# Patient Record
Sex: Male | Born: 1985 | Race: Black or African American | Hispanic: No | Marital: Single | Smoking: Current every day smoker
Health system: Southern US, Community
[De-identification: ages and names within clinical notes are randomized; demographics above are authoritative.]

## PROBLEM LIST (undated history)

## (undated) DIAGNOSIS — J45909 Unspecified asthma, uncomplicated: Secondary | ICD-10-CM

## (undated) HISTORY — PX: LEG SURGERY: SHX1003

---

## 2015-01-06 ENCOUNTER — Emergency Department (HOSPITAL_COMMUNITY)
Admission: EM | Admit: 2015-01-06 | Discharge: 2015-01-06 | Disposition: A | Discharge status: Home / Self Care | Payer: Self-pay | Attending: Emergency Medicine | Admitting: Emergency Medicine

## 2015-01-06 ENCOUNTER — Encounter (HOSPITAL_COMMUNITY): Payer: Self-pay | Admitting: Emergency Medicine

## 2015-01-06 ENCOUNTER — Emergency Department (HOSPITAL_COMMUNITY): Payer: Self-pay

## 2015-01-06 DIAGNOSIS — Y9389 Activity, other specified: Secondary | ICD-10-CM | POA: Insufficient documentation

## 2015-01-06 DIAGNOSIS — Y998 Other external cause status: Secondary | ICD-10-CM | POA: Insufficient documentation

## 2015-01-06 DIAGNOSIS — S02401A Maxillary fracture, unspecified, initial encounter for closed fracture: Secondary | ICD-10-CM | POA: Insufficient documentation

## 2015-01-06 DIAGNOSIS — S0011XA Contusion of right eyelid and periocular area, initial encounter: Secondary | ICD-10-CM | POA: Insufficient documentation

## 2015-01-06 DIAGNOSIS — J45909 Unspecified asthma, uncomplicated: Secondary | ICD-10-CM | POA: Insufficient documentation

## 2015-01-06 DIAGNOSIS — F17200 Nicotine dependence, unspecified, uncomplicated: Secondary | ICD-10-CM | POA: Insufficient documentation

## 2015-01-06 DIAGNOSIS — Y9289 Other specified places as the place of occurrence of the external cause: Secondary | ICD-10-CM | POA: Insufficient documentation

## 2015-01-06 DIAGNOSIS — K0889 Other specified disorders of teeth and supporting structures: Secondary | ICD-10-CM | POA: Insufficient documentation

## 2015-01-06 DIAGNOSIS — S0990XA Unspecified injury of head, initial encounter: Secondary | ICD-10-CM | POA: Insufficient documentation

## 2015-01-06 HISTORY — DX: Unspecified asthma, uncomplicated: J45.909

## 2015-01-06 MED ORDER — NAPROXEN 500 MG PO TABS: 500.0000 mg | ORAL_TABLET | Freq: Two times a day (BID) | ORAL | Status: DC

## 2015-01-06 MED ORDER — AMOXICILLIN 500 MG PO CAPS: 500.0000 mg | ORAL_CAPSULE | Freq: Three times a day (TID) | ORAL | Status: DC
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
## 2015-01-06 NOTE — Discharge Instructions (Signed)
Ice pack to the face. Take amoxil as prescribed until all gone for infection prevention. Naprosyn for pain. Follow up with Dr. Jeanice Limurham.

## 2015-01-15 ENCOUNTER — Emergency Department (HOSPITAL_COMMUNITY)
Admission: EM | Admit: 2015-01-15 | Discharge: 2015-01-15 | Disposition: A | Discharge status: Home / Self Care | Payer: Self-pay | Attending: Emergency Medicine | Admitting: Emergency Medicine

## 2015-01-15 ENCOUNTER — Encounter (HOSPITAL_COMMUNITY): Payer: Self-pay | Admitting: *Deleted

## 2015-01-15 DIAGNOSIS — Z792 Long term (current) use of antibiotics: Secondary | ICD-10-CM | POA: Insufficient documentation

## 2015-01-15 DIAGNOSIS — H5711 Ocular pain, right eye: Secondary | ICD-10-CM | POA: Insufficient documentation

## 2015-01-15 DIAGNOSIS — R51 Headache: Secondary | ICD-10-CM

## 2015-01-15 DIAGNOSIS — Z8781 Personal history of (healed) traumatic fracture: Secondary | ICD-10-CM | POA: Insufficient documentation

## 2015-01-15 DIAGNOSIS — R519 Headache, unspecified: Secondary | ICD-10-CM

## 2015-01-15 DIAGNOSIS — Z791 Long term (current) use of non-steroidal anti-inflammatories (NSAID): Secondary | ICD-10-CM | POA: Insufficient documentation

## 2015-01-15 DIAGNOSIS — F17200 Nicotine dependence, unspecified, uncomplicated: Secondary | ICD-10-CM | POA: Insufficient documentation

## 2015-01-15 DIAGNOSIS — R58 Hemorrhage, not elsewhere classified: Secondary | ICD-10-CM | POA: Insufficient documentation

## 2015-01-15 DIAGNOSIS — J45909 Unspecified asthma, uncomplicated: Secondary | ICD-10-CM | POA: Insufficient documentation

## 2015-01-15 MED ORDER — IBUPROFEN 400 MG PO TABS
800.0000 mg | ORAL_TABLET | Freq: Once | ORAL | Status: AC
  Administered 2015-01-15: 800 mg via ORAL
  Filled 2015-01-15: qty 2

## 2015-01-15 MED ORDER — IBUPROFEN 800 MG PO TABS: 800.0000 mg | ORAL_TABLET | Freq: Three times a day (TID) | ORAL | Status: DC
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
## 2015-01-15 NOTE — Discharge Instructions (Signed)
Mr. George Park,  Nice meeting you! Please follow-up with your primary care provider. Return to the emergency department if you develop inability to see, have increasing pain. Feel better soon!  S. Lane HackerNicole Kehinde Bowdish, PA-C

## 2015-02-25 ENCOUNTER — Encounter (HOSPITAL_COMMUNITY): Payer: Self-pay

## 2015-02-25 ENCOUNTER — Emergency Department (HOSPITAL_COMMUNITY)
Admission: EM | Admit: 2015-02-25 | Discharge: 2015-02-26 | Disposition: A | Discharge status: Home / Self Care | Payer: Self-pay | Attending: Emergency Medicine | Admitting: Emergency Medicine

## 2015-02-25 DIAGNOSIS — F17210 Nicotine dependence, cigarettes, uncomplicated: Secondary | ICD-10-CM | POA: Insufficient documentation

## 2015-02-25 DIAGNOSIS — H5711 Ocular pain, right eye: Secondary | ICD-10-CM | POA: Insufficient documentation

## 2015-02-25 DIAGNOSIS — R200 Anesthesia of skin: Secondary | ICD-10-CM | POA: Insufficient documentation

## 2015-02-25 DIAGNOSIS — Z8781 Personal history of (healed) traumatic fracture: Secondary | ICD-10-CM | POA: Insufficient documentation

## 2015-02-25 DIAGNOSIS — J45909 Unspecified asthma, uncomplicated: Secondary | ICD-10-CM | POA: Insufficient documentation

## 2015-02-25 DIAGNOSIS — L731 Pseudofolliculitis barbae: Secondary | ICD-10-CM | POA: Insufficient documentation

## 2015-02-25 NOTE — ED Notes (Signed)
PT CALLED IN WAITING ROOM BUT NO ANSWER
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
## 2015-02-25 NOTE — ED Notes (Signed)
CALLED IN WAITING ROOM X3 NO ANSWER

## 2015-02-26 ENCOUNTER — Encounter (HOSPITAL_COMMUNITY): Payer: Self-pay | Admitting: Emergency Medicine

## 2015-02-26 MED ORDER — IBUPROFEN 800 MG PO TABS
800.0000 mg | ORAL_TABLET | Freq: Once | ORAL | Status: AC
  Administered 2015-02-26: 800 mg via ORAL
  Filled 2015-02-26: qty 1

## 2015-02-26 MED ORDER — SULFAMETHOXAZOLE-TRIMETHOPRIM 800-160 MG PO TABS
1.0000 | ORAL_TABLET | Freq: Once | ORAL | Status: AC
  Administered 2015-02-26: 1 via ORAL
  Filled 2015-02-26: qty 1

## 2015-02-26 MED ORDER — IBUPROFEN 800 MG PO TABS: 800.0000 mg | ORAL_TABLET | Freq: Three times a day (TID) | ORAL | Status: DC

## 2015-02-26 MED ORDER — SULFAMETHOXAZOLE-TRIMETHOPRIM 800-160 MG PO TABS: 1.0000 | ORAL_TABLET | Freq: Two times a day (BID) | ORAL | Status: AC
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
## 2015-02-26 NOTE — Discharge Instructions (Signed)
Ingrown Hair An ingrown hair is a hair that curls and re-enters the skin instead of growing straight out of the skin. It happens most often with curly hair. It is usually more severe in the neck area, but it can occur in any shaved area, including the beard area, groin, scalp, and legs. An ingrown hair may cause small pockets of infection. CAUSES  Shaving closely, tweezing, or waxing, especially curly hair. Using hair removal creams can sometimes lead to ingrown hairs, especially in the groin. SYMPTOMS   Small bumps on the skin. The bumps may be filled with pus.  Pain.  Itching. DIAGNOSIS  Your caregiver can usually tell what is wrong by doing a physical exam. TREATMENT  If there is a severe infection, your caregiver may prescribe antibiotic medicines. Laser hair removal may also be done to help prevent regrowth of the hair. HOME CARE INSTRUCTIONS   Do not shave irritated skin. You may start shaving again once the irritation has gone away.  If you are prone to ingrown hairs, consider not shaving as much as possible.  If antibiotics are prescribed, take them as directed. Finish them even if you start to feel better.  You may use a facial sponge in a gentle circular motion to help dislodge ingrown hairs on the face.  You may use a hair removal cream weekly, especially on the legs and underarms. Stop using the cream if it irritates your skin. Use caution when using hair removal creams in the groin area. SHAVING INSTRUCTIONS AFTER TREATMENT  Shower before shaving. Keep areas to be shaved packed in warm, moist wraps for several minutes before shaving. The warm, moist environment helps soften the hairs and makes ingrown hairs less likely to occur.  Use thick shaving gels.  Use a bump fighter razor that cuts hair slightly above the skin level or use an electric shaver with a longer shave setting.  Shave in the direction of hair growth. Avoid making multiple razor strokes.  Use  moisturizing lotions after shaving.   This information is not intended to replace advice given to you by your health care provider. Make sure you discuss any questions you have with your health care provider.   Document Released: 04/07/2000 Document Revised: 07/01/2011 Document Reviewed: 04/01/2011 Elsevier Interactive Patient Education 2016 Elsevier Inc.  

## 2015-02-26 NOTE — ED Notes (Signed)
MD at bedside. 

## 2015-05-24 ENCOUNTER — Encounter (HOSPITAL_COMMUNITY): Payer: Self-pay | Admitting: Emergency Medicine

## 2015-05-24 ENCOUNTER — Emergency Department (HOSPITAL_COMMUNITY)
Admission: EM | Admit: 2015-05-24 | Discharge: 2015-05-24 | Disposition: A | Discharge status: Home / Self Care | Payer: Self-pay | Attending: Emergency Medicine | Admitting: Emergency Medicine

## 2015-05-24 DIAGNOSIS — S0083XA Contusion of other part of head, initial encounter: Secondary | ICD-10-CM | POA: Insufficient documentation

## 2015-05-24 DIAGNOSIS — Z791 Long term (current) use of non-steroidal anti-inflammatories (NSAID): Secondary | ICD-10-CM | POA: Insufficient documentation

## 2015-05-24 DIAGNOSIS — J45909 Unspecified asthma, uncomplicated: Secondary | ICD-10-CM | POA: Insufficient documentation

## 2015-05-24 DIAGNOSIS — Y9389 Activity, other specified: Secondary | ICD-10-CM | POA: Insufficient documentation

## 2015-05-24 DIAGNOSIS — W228XXA Striking against or struck by other objects, initial encounter: Secondary | ICD-10-CM | POA: Insufficient documentation

## 2015-05-24 DIAGNOSIS — F17210 Nicotine dependence, cigarettes, uncomplicated: Secondary | ICD-10-CM | POA: Insufficient documentation

## 2015-05-24 DIAGNOSIS — Y998 Other external cause status: Secondary | ICD-10-CM | POA: Insufficient documentation

## 2015-05-24 DIAGNOSIS — Y9289 Other specified places as the place of occurrence of the external cause: Secondary | ICD-10-CM | POA: Insufficient documentation

## 2015-05-24 MED ORDER — IBUPROFEN 400 MG PO TABS
400.0000 mg | ORAL_TABLET | Freq: Once | ORAL | Status: AC
  Administered 2015-05-24: 400 mg via ORAL
  Filled 2015-05-24: qty 1

## 2015-05-24 NOTE — Discharge Instructions (Signed)
Rest, Ice intermittently (in the first 24-48 hours), Gentle compression with an Ace wrap, and elevate (Limb above the level of the heart)   Take up to 800mg  of ibuprofen (that is usually 4 over the counter pills)  3 times a day for 5 days. Take with food.   Contusion A contusion is a deep bruise. Contusions are the result of a blunt injury to tissues and muscle fibers under the skin. The injury causes bleeding under the skin. The skin overlying the contusion may turn blue, purple, or yellow. Minor injuries will give you a painless contusion, but more severe contusions may stay painful and swollen for a few weeks.  CAUSES  This condition is usually caused by a blow, trauma, or direct force to an area of the body. SYMPTOMS  Symptoms of this condition include:  Swelling of the injured area.  Pain and tenderness in the injured area.  Discoloration. The area may have redness and then turn blue, purple, or yellow. DIAGNOSIS  This condition is diagnosed based on a physical exam and medical history. An X-ray, CT scan, or MRI may be needed to determine if there are any associated injuries, such as broken bones (fractures). TREATMENT  Specific treatment for this condition depends on what area of the body was injured. In general, the best treatment for a contusion is resting, icing, applying pressure to (compression), and elevating the injured area. This is often called the RICE strategy. Over-the-counter anti-inflammatory medicines may also be recommended for pain control.  HOME CARE INSTRUCTIONS   Rest the injured area.  If directed, apply ice to the injured area:  Put ice in a plastic bag.  Place a towel between your skin and the bag.  Leave the ice on for 20 minutes, 2-3 times per day.  If directed, apply light compression to the injured area using an elastic bandage. Make sure the bandage is not wrapped too tightly. Remove and reapply the bandage as directed by your health care  provider.  If possible, raise (elevate) the injured area above the level of your heart while you are sitting or lying down.  Take over-the-counter and prescription medicines only as told by your health care provider. SEEK MEDICAL CARE IF:  Your symptoms do not improve after several days of treatment.  Your symptoms get worse.  You have difficulty moving the injured area. SEEK IMMEDIATE MEDICAL CARE IF:   You have severe pain.  You have numbness in a hand or foot.  Your hand or foot turns pale or cold.   This information is not intended to replace advice given to you by your health care provider. Make sure you discuss any questions you have with your health care provider.   Document Released: 10/09/2004 Document Revised: 09/20/2014 Document Reviewed: 05/17/2014 Elsevier Interactive Patient Education [employer]2016 [employer]
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
## 2015-05-24 NOTE — ED Notes (Signed)
Pt. reports right upper molar pain and right lower jaw swelling onset 5 days ago .
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
## 2021-11-26 ENCOUNTER — Emergency Department (HOSPITAL_COMMUNITY)
Admission: EM | Admit: 2021-11-26 | Discharge: 2021-11-26 | Disposition: A | Discharge status: Home / Self Care | Attending: Emergency Medicine | Admitting: Emergency Medicine

## 2021-11-26 ENCOUNTER — Other Ambulatory Visit: Payer: Self-pay

## 2021-11-26 ENCOUNTER — Emergency Department (HOSPITAL_COMMUNITY)

## 2021-11-26 ENCOUNTER — Encounter (HOSPITAL_COMMUNITY): Payer: Self-pay | Admitting: Emergency Medicine

## 2021-11-26 DIAGNOSIS — W19XXXA Unspecified fall, initial encounter: Secondary | ICD-10-CM

## 2021-11-26 DIAGNOSIS — Y92149 Unspecified place in prison as the place of occurrence of the external cause: Secondary | ICD-10-CM | POA: Diagnosis not present

## 2021-11-26 DIAGNOSIS — S0990XA Unspecified injury of head, initial encounter: Secondary | ICD-10-CM | POA: Diagnosis present

## 2021-11-26 DIAGNOSIS — R0789 Other chest pain: Secondary | ICD-10-CM | POA: Diagnosis not present

## 2021-11-26 DIAGNOSIS — W108XXA Fall (on) (from) other stairs and steps, initial encounter: Secondary | ICD-10-CM | POA: Insufficient documentation

## 2021-11-26 DIAGNOSIS — S02832A Fracture of medial orbital wall, left side, initial encounter for closed fracture: Secondary | ICD-10-CM | POA: Insufficient documentation

## 2021-11-26 DIAGNOSIS — S0285XA Fracture of orbit, unspecified, initial encounter for closed fracture: Secondary | ICD-10-CM

## 2021-11-26 LAB — BASIC METABOLIC PANEL
Anion gap: 9 (ref 5–15)
BUN: 16 mg/dL (ref 6–20)
CO2: 28 mmol/L (ref 22–32)
Calcium: 9 mg/dL (ref 8.9–10.3)
Chloride: 101 mmol/L (ref 98–111)
Creatinine, Ser: 0.87 mg/dL (ref 0.61–1.24)
GFR, Estimated: >60 mL/min (ref >60)
Glucose, Bld: 107 mg/dL — ABNORMAL HIGH (ref 70–99)
Potassium: 4.1 mmol/L (ref 3.5–5.1)
Sodium: 138 mmol/L (ref 135–145)

## 2021-11-26 LAB — CBC
HCT: 46 % (ref 39.0–52.0)
Hemoglobin: 15 g/dL (ref 13.0–17.0)
MCH: 27.6 pg (ref 26.0–34.0)
MCHC: 32.6 g/dL (ref 30.0–36.0)
MCV: 84.7 fL (ref 80.0–100.0)
Platelets: 258 10*3/uL (ref 150–400)
RBC: 5.43 MIL/uL (ref 4.22–5.81)
RDW: 15 % (ref 11.5–15.5)
WBC: 7.1 10*3/uL (ref 4.0–10.5)
nRBC: 0 % (ref 0.0–0.2)

## 2021-11-26 LAB — TROPONIN I (HIGH SENSITIVITY)
Troponin I (High Sensitivity): <2 ng/L (ref <18)
Troponin I (High Sensitivity): <2 ng/L (ref <18)

## 2021-11-26 NOTE — ED Notes (Signed)
Patient verbalizes understanding of discharge instructions. Opportunity for questioning and answers were provided. Armband removed by staff, pt discharged from ED. Escorted out with [employer]
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
## 2022-01-01 ENCOUNTER — Emergency Department (HOSPITAL_COMMUNITY)

## 2022-01-01 ENCOUNTER — Other Ambulatory Visit: Payer: Self-pay

## 2022-01-01 ENCOUNTER — Emergency Department (HOSPITAL_COMMUNITY)
Admission: EM | Admit: 2022-01-01 | Discharge: 2022-01-02 | Disposition: A | Discharge status: Home / Self Care | Attending: Emergency Medicine | Admitting: Emergency Medicine

## 2022-01-01 ENCOUNTER — Encounter (HOSPITAL_COMMUNITY): Payer: Self-pay | Admitting: Emergency Medicine

## 2022-01-01 DIAGNOSIS — M549 Dorsalgia, unspecified: Secondary | ICD-10-CM | POA: Insufficient documentation

## 2022-01-01 DIAGNOSIS — R079 Chest pain, unspecified: Secondary | ICD-10-CM | POA: Insufficient documentation

## 2022-01-01 LAB — CBC
HCT: 42.6 % (ref 39.0–52.0)
Hemoglobin: 13.2 g/dL (ref 13.0–17.0)
MCH: 26.8 pg (ref 26.0–34.0)
MCHC: 31 g/dL (ref 30.0–36.0)
MCV: 86.4 fL (ref 80.0–100.0)
Platelets: 245 10*3/uL (ref 150–400)
RBC: 4.93 MIL/uL (ref 4.22–5.81)
RDW: 14.3 % (ref 11.5–15.5)
WBC: 5.2 10*3/uL (ref 4.0–10.5)
nRBC: 0 % (ref 0.0–0.2)

## 2022-01-01 LAB — TROPONIN I (HIGH SENSITIVITY)
Troponin I (High Sensitivity): <2 ng/L (ref <18)
Troponin I (High Sensitivity): <2 ng/L (ref <18)

## 2022-01-01 LAB — COMPREHENSIVE METABOLIC PANEL
ALT: 48 U/L — ABNORMAL HIGH (ref 0–44)
AST: 37 U/L (ref 15–41)
Albumin: 3.6 g/dL (ref 3.5–5.0)
Alkaline Phosphatase: 60 U/L (ref 38–126)
Anion gap: 6 (ref 5–15)
BUN: 10 mg/dL (ref 6–20)
CO2: 31 mmol/L (ref 22–32)
Calcium: 8.9 mg/dL (ref 8.9–10.3)
Chloride: 101 mmol/L (ref 98–111)
Creatinine, Ser: 0.89 mg/dL (ref 0.61–1.24)
GFR, Estimated: >60 mL/min (ref >60)
Glucose, Bld: 83 mg/dL (ref 70–99)
Potassium: 4.1 mmol/L (ref 3.5–5.1)
Sodium: 138 mmol/L (ref 135–145)
Total Bilirubin: 0.5 mg/dL (ref 0.3–1.2)
Total Protein: 6.6 g/dL (ref 6.5–8.1)

## 2022-01-01 LAB — LIPASE, BLOOD: Lipase: 23 U/L (ref 11–51)

## 2022-01-01 LAB — MAGNESIUM: Magnesium: 1.9 mg/dL (ref 1.7–2.4)

## 2022-01-01 MED ORDER — NITROGLYCERIN 2 % TD OINT
1.0000 [in_us] | TOPICAL_OINTMENT | Freq: Once | TRANSDERMAL | Status: AC
  Administered 2022-01-01: 1 [in_us] via TOPICAL
  Filled 2022-01-01: qty 1

## 2022-01-01 MED ORDER — MORPHINE SULFATE (PF) 2 MG/ML IV SOLN
2.0000 mg | Freq: Once | INTRAVENOUS | Status: AC
  Administered 2022-01-01: 2 mg via INTRAVENOUS
  Filled 2022-01-01: qty 1
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
## 2022-01-01 NOTE — ED Triage Notes (Signed)
Pt BIB [facility] from jail for 8/10 CP in center of chest radiating to back. PT received 324 ASA & 1 nitro.   EMS vitals 120/90 96% HR 70

## 2022-01-01 NOTE — ED Notes (Signed)
Shift report received, assumed care of patient at this time 

## 2022-02-15 ENCOUNTER — Emergency Department (HOSPITAL_COMMUNITY)
Admission: EM | Admit: 2022-02-15 | Discharge: 2022-02-15 | Attending: Emergency Medicine | Admitting: Emergency Medicine

## 2022-02-15 DIAGNOSIS — T6591XA Toxic effect of unspecified substance, accidental (unintentional), initial encounter: Secondary | ICD-10-CM | POA: Diagnosis present

## 2022-02-15 LAB — I-STAT CHEM 8, ED
BUN: 8 mg/dL (ref 6–20)
Calcium, Ion: 1.19 mmol/L (ref 1.15–1.40)
Chloride: 100 mmol/L (ref 98–111)
Creatinine, Ser: 0.9 mg/dL (ref 0.61–1.24)
Glucose, Bld: 96 mg/dL (ref 70–99)
HCT: 42 % (ref 39.0–52.0)
Hemoglobin: 14.3 g/dL (ref 13.0–17.0)
Potassium: 4 mmol/L (ref 3.5–5.1)
Sodium: 141 mmol/L (ref 135–145)
TCO2: 30 mmol/L (ref 22–32)

## 2022-02-15 LAB — CBC WITH DIFFERENTIAL/PLATELET
Abs Immature Granulocytes: 0.01 10*3/uL (ref 0.00–0.07)
Basophils Absolute: 0 10*3/uL (ref 0.0–0.1)
Basophils Relative: 1 %
Eosinophils Absolute: 0.2 10*3/uL (ref 0.0–0.5)
Eosinophils Relative: 4 %
HCT: 39.5 % (ref 39.0–52.0)
Hemoglobin: 12.9 g/dL — ABNORMAL LOW (ref 13.0–17.0)
Immature Granulocytes: 0 %
Lymphocytes Relative: 30 %
Lymphs Abs: 1.5 10*3/uL (ref 0.7–4.0)
MCH: 28 pg (ref 26.0–34.0)
MCHC: 32.7 g/dL (ref 30.0–36.0)
MCV: 85.9 fL (ref 80.0–100.0)
Monocytes Absolute: 0.4 10*3/uL (ref 0.1–1.0)
Monocytes Relative: 7 %
Neutro Abs: 3 10*3/uL (ref 1.7–7.7)
Neutrophils Relative %: 58 %
Platelets: 221 10*3/uL (ref 150–400)
RBC: 4.6 MIL/uL (ref 4.22–5.81)
RDW: 13.7 % (ref 11.5–15.5)
WBC: 5.2 10*3/uL (ref 4.0–10.5)
nRBC: 0 % (ref 0.0–0.2)

## 2022-02-15 LAB — RAPID URINE DRUG SCREEN, HOSP PERFORMED
Amphetamines: NOT DETECTED
Barbiturates: NOT DETECTED
Benzodiazepines: NOT DETECTED
Cocaine: NOT DETECTED
Opiates: NOT DETECTED
Tetrahydrocannabinol: NOT DETECTED
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
## 2022-02-15 NOTE — ED Notes (Signed)
Staff officer from jail accompanies pt and remains at bedside

## 2022-02-15 NOTE — Discharge Instructions (Signed)
Please have frequent checks Return if any change in mental status

## 2022-02-15 NOTE — ED Provider Notes (Signed)
[facility] Provider Note   CSN: [id number] Arrival date & time: 02/15/22  0602     History  Chief Complaint  Patient presents with   Ingestion    George Park is a 37 y.o. male.  The history is provided by the patient and the police.  Ingestion This is a new problem. The current episode started 1 to 2 hours ago. The problem occurs constantly. The problem has been resolved. Pertinent negatives include no chest pain, no abdominal pain, no headaches and no shortness of breath. Nothing aggravates the symptoms. Nothing relieves the symptoms. He has tried nothing for the symptoms. The treatment provided moderate relief.  Brought in by police for being found smoking something in the jail.  12 mg of narcan given without change.  Patient denying ingestion.  No SI or HI.       Home Medications Prior to Admission medications   Medication Sig Start Date End Date Taking? Authorizing Provider  amoxicillin (AMOXIL) 500 MG capsule Take 1 capsule (500 mg total) by mouth 3 (three) times daily. Patient not taking: Reported on 02/26/2015 01/06/15   Jeannett Senior, PA-C  ibuprofen (ADVIL,MOTRIN) 800 MG tablet Take 1 tablet (800 mg total) by mouth 3 (three) times daily. Patient not taking: Reported on 02/26/2015 01/15/15   [place] Lions, PA-C  ibuprofen (ADVIL,MOTRIN) 800 MG tablet Take 1 tablet (800 mg total) by mouth 3 (three) times daily. 02/26/15   Krisandra Bueno, MD      Allergies    Patient has no known allergies.    Review of Systems   Review of Systems  Respiratory:  Negative for shortness of breath.   Cardiovascular:  Negative for chest pain.  Gastrointestinal:  Negative for abdominal pain.  Neurological:  Negative for headaches.  Psychiatric/Behavioral:  Negative for self-injury. The patient is not nervous/anxious.   All other systems reviewed and are negative.   Physical Exam Updated Vital Signs BP 118/79   Pulse (!)  56   Temp 98.6 F (37 C) (Oral)   Resp 12   SpO2 98%  Physical Exam Vitals and nursing note reviewed. Exam conducted with a chaperone present.  Constitutional:      General: He is not in acute distress.    Appearance: He is well-developed. He is not diaphoretic.  HENT:     Head: Normocephalic and atraumatic.  Eyes:     Conjunctiva/sclera: Conjunctivae normal.     Comments: Dilated pupils 9 mm but reactive.    Cardiovascular:     Rate and Rhythm: Normal rate and regular rhythm.  Pulmonary:     Effort: Pulmonary effort is normal.     Breath sounds: Normal breath sounds. No wheezing or rales.  Abdominal:     General: Abdomen is flat. Bowel sounds are normal.     Palpations: Abdomen is soft.     Tenderness: There is no abdominal tenderness. There is no guarding or rebound.  Musculoskeletal:        General: Normal range of motion.     Cervical back: Normal range of motion and neck supple.  Skin:    General: Skin is warm and dry.  Neurological:     Mental Status: He is alert and oriented to person, place, and time.     ED Results / Procedures / Treatments   Labs (all labs ordered are listed, but only abnormal results are displayed) Results for orders placed or performed during the hospital encounter of 02/15/22  CBC with Differential  Result Value Ref Range   WBC 5.2 4.0 - 10.5 K/uL   RBC 4.60 4.22 - 5.81 MIL/uL   Hemoglobin 12.9 (L) 13.0 - 17.0 g/dL   HCT 39.5 39.0 - 52.0 %   MCV 85.9 80.0 - 100.0 fL   MCH 28.0 26.0 - 34.0 pg   MCHC 32.7 30.0 - 36.0 g/dL   RDW 13.7 11.5 - 15.5 %   Platelets 221 150 - 400 K/uL   nRBC 0.0 0.0 - 0.2 %   Neutrophils Relative % 58 %   Neutro Abs 3.0 1.7 - 7.7 K/uL   Lymphocytes Relative 30 %   Lymphs Abs 1.5 0.7 - 4.0 K/uL   Monocytes Relative 7 %   Monocytes Absolute 0.4 0.1 - 1.0 K/uL   Eosinophils Relative 4 %   Eosinophils Absolute 0.2 0.0 - 0.5 K/uL   Basophils Relative 1 %   Basophils Absolute 0.0 0.0 - 0.1 K/uL   Immature  Granulocytes 0 %   Abs Immature Granulocytes 0.01 0.00 - 0.07 K/uL  I-stat chem 8, ED (not at [facility], [facility] or [facility])  Result Value Ref Range   Sodium 141 135 - 145 mmol/L   Potassium 4.0 3.5 - 5.1 mmol/L   Chloride 100 98 - 111 mmol/L   BUN 8 6 - 20 mg/dL   Creatinine, Ser 0.90 0.61 - 1.24 mg/dL   Glucose, Bld 96 70 - 99 mg/dL   Calcium, Ion 1.19 1.15 - 1.40 mmol/L   TCO2 30 22 - 32 mmol/L   Hemoglobin 14.3 13.0 - 17.0 g/dL   HCT 42.0 39.0 - 52.0 %   No results found.  EKG Interpretation  Date/Time:  Saturday February 15 2022 06:08:57 EST Ventricular Rate:  56 PR Interval:  178 QRS Duration: 92 QT Interval:  407 QTC Calculation: 393 R Axis:   66 Text Interpretation: Sinus rhythm Confirmed by Randal Buba, Fatimata Talsma ([id number]) on 02/15/2022 6:32:34 AM  Radiology No results found.  Procedures Procedures    Medications Ordered in ED Medications - No data to display  ED Course/ Medical Decision Making/ A&P                             Medical Decision Making Patient smoking something in jail, BIB police for evaluation   Amount and/or Complexity of Data Reviewed Independent Historian:     Details: Police see above  External Data Reviewed: notes.    Details: Previous notes reviewed  Labs: ordered.    Details: All labs reviewed:  sodium normal 141 normal potassium 4, normal creatinine .9.  Normal white count 5.2, hemoglobin 12.9, slight low, normal platelet count   Risk Risk Details: Signed out to Dr. Jeanell Sparrow pending labs and metabolization of substance.  Will need to PO challenge and then will be discharged into police custody.      Final Clinical Impression(s) / ED Diagnoses   Signed out to Dr. Jeanell Sparrow pending UDS and alertness as well as PO challenge.       Annaleia Pence, MD 02/15/22 [phone]

## 2022-02-15 NOTE — ED Provider Notes (Signed)
37 yo male from jail with "smoking" something Awake but sleepy here Denies drug use Observe  and will d/c  Patient awake and alert and stable for d/c   Pattricia Boss, MD 02/15/22 [phone]

## 2022-02-15 NOTE — ED Triage Notes (Signed)
Pt arrives via [facility] from jail after being found by staff smoking "something". Pt was administered 12mg  Narcan with no results. Pt denies smoking anything and also denies any illicit drug use.

## 2022-03-25 ENCOUNTER — Ambulatory Visit: Payer: Self-pay | Admitting: General Surgery

## 2022-03-25 NOTE — Progress Notes (Addendum)
I called the phone number listed for the [facility] center , with the medication list is not correct.   I left a message x 2 requesting a call.  The phone number listed in contact has a message and allows one to pick a area or person, none of the prompts address the nurses station. I called the number that is listed in the medications list again, someone picked up. The lady who picked up works for [facility] [employer], not the detention center, she is going to see if she can get the number for the detention center and call me back. I did not get a return call.  I called Sherry Ruffing and informed `him of the difficulty I have had trying to reach the nurses station. Neil Crouch gave me two phone numbers, said to call back if these numbers did not work.  I called one number x 2 and left a voice message. I called the other number and did not get an answer. I left a message for Sherry Ruffing informing him that I have not received a call back.  I called Dr. Rockne Coons desk to ask for orders, I also ask if they have a nurse station in the detention center' phone number. I was given a phone number.  I called the number I was given, it was for [facility]  detention medical, I was given a number for [facility] center., the [place] transferred me to [place] , [place] at [facility].   Janan Halter reports that Mr. Kovacevich has not had c/o chest pain or shortness of breath.  Janan Halter reports that patient has no s/s of Covid and has not been exposed.  Janan Halter also reports that patient has no s/s of upper lower respiratory infections. Mr. Higgs last VS were taken 03/21/22 (order for every week) BP was 117/60, Pulse  52, pulse ox 99, Temp 98.3.  I gave Janan Halter instructions of arrival time, hygiene, NPO,  Only medication to take Venlaxaine.

## 2022-03-25 NOTE — Anesthesia Preprocedure Evaluation (Signed)
Anesthesia Evaluation  Patient identified by MRN, date of birth, ID band Patient awake    Reviewed: Allergy & Precautions, NPO status , Patient's Chart, lab work & pertinent test results  History of Anesthesia Complications Negative for: history of anesthetic complications  Airway Mallampati: II  TM Distance: >3 FB Neck ROM: Full    Dental  (+) Dental Advisory Given,    Pulmonary neg shortness of breath, asthma (does not use inhalers) , neg sleep apnea, neg COPD, neg recent URI, Current Smoker   Pulmonary exam normal breath sounds clear to auscultation       Cardiovascular negative cardio ROS  Rhythm:Regular Rate:Normal     Neuro/Psych negative neurological ROS     GI/Hepatic negative GI ROS, Neg liver ROS,,,  Endo/Other  negative endocrine ROS    Renal/GU negative Renal ROS     Musculoskeletal   Abdominal   Peds  Hematology negative hematology ROS (+)   Anesthesia Other Findings   Reproductive/Obstetrics                             Anesthesia Physical Anesthesia Plan  ASA: 2  Anesthesia Plan: General   Post-op Pain Management: Tylenol PO (pre-op)*   Induction: Intravenous  PONV Risk Score and Plan: 1 and Ondansetron, Dexamethasone and Treatment may vary due to age or medical condition  Airway Management Planned: Oral ETT  Additional Equipment:   Intra-op Plan:   Post-operative Plan: Extubation in OR  Informed Consent: I have reviewed the patients History and Physical, chart, labs and discussed the procedure including the risks, benefits and alternatives for the proposed anesthesia with the patient or authorized representative who has indicated his/her understanding and acceptance.     Dental advisory given  Plan Discussed with: CRNA and Anesthesiologist  Anesthesia Plan Comments: (Risks of general anesthesia discussed including, but not limited to, sore throat, hoarse  voice, chipped/damaged teeth, injury to vocal cords, nausea and vomiting, allergic reactions, lung infection, heart attack, stroke, and death. All questions answered. )       Anesthesia Quick Evaluation

## 2022-03-26 ENCOUNTER — Ambulatory Visit (HOSPITAL_COMMUNITY)
Admission: RE | Admit: 2022-03-26 | Discharge: 2022-03-26 | Disposition: A | Discharge status: Home / Self Care | Attending: General Surgery | Admitting: General Surgery

## 2022-03-26 ENCOUNTER — Other Ambulatory Visit: Payer: Self-pay

## 2022-03-26 ENCOUNTER — Ambulatory Visit (HOSPITAL_BASED_OUTPATIENT_CLINIC_OR_DEPARTMENT_OTHER): Admitting: Anesthesiology

## 2022-03-26 ENCOUNTER — Encounter (HOSPITAL_COMMUNITY): Payer: Self-pay | Admitting: General Surgery

## 2022-03-26 ENCOUNTER — Encounter (HOSPITAL_COMMUNITY)
Admission: RE | Disposition: A | Discharge status: Home / Self Care | Payer: Self-pay | Source: Home / Self Care | Attending: General Surgery

## 2022-03-26 ENCOUNTER — Ambulatory Visit (HOSPITAL_COMMUNITY): Admitting: Anesthesiology

## 2022-03-26 DIAGNOSIS — K429 Umbilical hernia without obstruction or gangrene: Secondary | ICD-10-CM | POA: Diagnosis present

## 2022-03-26 DIAGNOSIS — J45909 Unspecified asthma, uncomplicated: Secondary | ICD-10-CM | POA: Insufficient documentation

## 2022-03-26 DIAGNOSIS — F17210 Nicotine dependence, cigarettes, uncomplicated: Secondary | ICD-10-CM | POA: Diagnosis not present

## 2022-03-26 HISTORY — PX: SUPRA-UMBILICAL HERNIA: SHX6105

## 2022-03-26 LAB — CBC
HCT: 44.3 % (ref 39.0–52.0)
Hemoglobin: 13.8 g/dL (ref 13.0–17.0)
MCH: 26.8 pg (ref 26.0–34.0)
MCHC: 31.2 g/dL (ref 30.0–36.0)
MCV: 86.2 fL (ref 80.0–100.0)
Platelets: 233 10*3/uL (ref 150–400)
RBC: 5.14 MIL/uL (ref 4.22–5.81)
RDW: 13.2 % (ref 11.5–15.5)
WBC: 4.9 10*3/uL (ref 4.0–10.5)
nRBC: 0 % (ref 0.0–0.2)

## 2022-03-26 SURGERY — SUPRA-UMBILICAL HERNIA
Anesthesia: General

## 2022-03-26 MED ORDER — DEXAMETHASONE SODIUM PHOSPHATE 10 MG/ML IJ SOLN
INTRAMUSCULAR | Status: DC | PRN
  Administered 2022-03-26: 10 mg via INTRAVENOUS

## 2022-03-26 MED ORDER — PROPOFOL 10 MG/ML IV BOLUS
INTRAVENOUS | Status: AC
  Filled 2022-03-26: qty 20

## 2022-03-26 MED ORDER — ONDANSETRON HCL 4 MG/2ML IJ SOLN
INTRAMUSCULAR | Status: DC | PRN
  Administered 2022-03-26: 4 mg via INTRAVENOUS

## 2022-03-26 MED ORDER — ACETAMINOPHEN 500 MG PO TABS
1000.0000 mg | ORAL_TABLET | ORAL | Status: AC
  Administered 2022-03-26: 1000 mg via ORAL
  Filled 2022-03-26: qty 2

## 2022-03-26 MED ORDER — OXYCODONE HCL 5 MG/5ML PO SOLN: 5.0000 mg | Freq: Once | ORAL | Status: DC | PRN

## 2022-03-26 MED ORDER — LIDOCAINE 2% (20 MG/ML) 5 ML SYRINGE
INTRAMUSCULAR | Status: DC | PRN
  Administered 2022-03-26: 80 mg via INTRAVENOUS

## 2022-03-26 MED ORDER — SODIUM CHLORIDE 0.9 % IR SOLN
Status: DC | PRN
  Administered 2022-03-26: 1000 mL

## 2022-03-26 MED ORDER — FENTANYL CITRATE (PF) 250 MCG/5ML IJ SOLN
INTRAMUSCULAR | Status: DC | PRN
  Administered 2022-03-26: 100 ug via INTRAVENOUS

## 2022-03-26 MED ORDER — ROCURONIUM BROMIDE 10 MG/ML (PF) SYRINGE
PREFILLED_SYRINGE | INTRAVENOUS | Status: DC | PRN
  Administered 2022-03-26: 50 mg via INTRAVENOUS

## 2022-03-26 MED ORDER — ORAL CARE MOUTH RINSE: 15.0000 mL | Freq: Once | OROMUCOSAL | Status: AC

## 2022-03-26 MED ORDER — ENSURE PRE-SURGERY PO LIQD: 296.0000 mL | Freq: Once | ORAL | Status: DC

## 2022-03-26 MED ORDER — DEXAMETHASONE SODIUM PHOSPHATE 10 MG/ML IJ SOLN
INTRAMUSCULAR | Status: AC
  Filled 2022-03-26: qty 1

## 2022-03-26 MED ORDER — FENTANYL CITRATE (PF) 250 MCG/5ML IJ SOLN
INTRAMUSCULAR | Status: AC
  Filled 2022-03-26: qty 5

## 2022-03-26 MED ORDER — CEFAZOLIN SODIUM-DEXTROSE 2-4 GM/100ML-% IV SOLN
2.0000 g | INTRAVENOUS | Status: AC
  Administered 2022-03-26: 2 g via INTRAVENOUS
  Filled 2022-03-26: qty 100

## 2022-03-26 MED ORDER — CHLORHEXIDINE GLUCONATE CLOTH 2 % EX PADS: 6.0000 | MEDICATED_PAD | Freq: Once | CUTANEOUS | Status: DC

## 2022-03-26 MED ORDER — MIDAZOLAM HCL 2 MG/2ML IJ SOLN
INTRAMUSCULAR | Status: AC
  Filled 2022-03-26: qty 2

## 2022-03-26 MED ORDER — ROCURONIUM BROMIDE 10 MG/ML (PF) SYRINGE
PREFILLED_SYRINGE | INTRAVENOUS | Status: AC
  Filled 2022-03-26: qty 10

## 2022-03-26 MED ORDER — HYDROMORPHONE HCL 1 MG/ML IJ SOLN
0.2500 mg | INTRAMUSCULAR | Status: DC | PRN
  Administered 2022-03-26 (×2): 0.5 mg via INTRAVENOUS

## 2022-03-26 MED ORDER — LACTATED RINGERS IV SOLN: INTRAVENOUS | Status: DC | PRN

## 2022-03-26 MED ORDER — OXYCODONE HCL 5 MG PO TABS: 5.0000 mg | ORAL_TABLET | Freq: Once | ORAL | Status: DC | PRN

## 2022-03-26 MED ORDER — BUPIVACAINE-EPINEPHRINE (PF) 0.25% -1:200000 IJ SOLN
INTRAMUSCULAR | Status: AC
  Filled 2022-03-26: qty 30

## 2022-03-26 MED ORDER — BUPIVACAINE-EPINEPHRINE (PF) 0.25% -1:200000 IJ SOLN
INTRAMUSCULAR | Status: DC | PRN
  Administered 2022-03-26: 10 mL via PERINEURAL

## 2022-03-26 MED ORDER — ONDANSETRON HCL 4 MG/2ML IJ SOLN
INTRAMUSCULAR | Status: AC
  Filled 2022-03-26: qty 2

## 2022-03-26 MED ORDER — HYDROMORPHONE HCL 1 MG/ML IJ SOLN
INTRAMUSCULAR | Status: AC
  Filled 2022-03-26: qty 1

## 2022-03-26 MED ORDER — PROMETHAZINE HCL 25 MG/ML IJ SOLN: 6.2500 mg | INTRAMUSCULAR | Status: DC | PRN

## 2022-03-26 MED ORDER — LIDOCAINE 2% (20 MG/ML) 5 ML SYRINGE
INTRAMUSCULAR | Status: AC
  Filled 2022-03-26: qty 5

## 2022-03-26 MED ORDER — DEXMEDETOMIDINE HCL IN NACL 80 MCG/20ML IV SOLN
INTRAVENOUS | Status: DC | PRN
  Administered 2022-03-26: 12 ug via BUCCAL

## 2022-03-26 MED ORDER — SUGAMMADEX SODIUM 200 MG/2ML IV SOLN
INTRAVENOUS | Status: DC | PRN
  Administered 2022-03-26: 200 mg via INTRAVENOUS

## 2022-03-26 MED ORDER — CHLORHEXIDINE GLUCONATE 0.12 % MT SOLN
15.0000 mL | Freq: Once | OROMUCOSAL | Status: AC
  Administered 2022-03-26: 15 mL via OROMUCOSAL
  Filled 2022-03-26: qty 15

## 2022-03-26 MED ORDER — PROPOFOL 10 MG/ML IV BOLUS
INTRAVENOUS | Status: DC | PRN
  Administered 2022-03-26: 200 mg via INTRAVENOUS

## 2022-03-26 MED ORDER — GABAPENTIN 300 MG PO CAPS
300.0000 mg | ORAL_CAPSULE | ORAL | Status: AC
  Administered 2022-03-26: 300 mg via ORAL
  Filled 2022-03-26: qty 1

## 2022-03-26 MED ORDER — CELECOXIB 200 MG PO CAPS
400.0000 mg | ORAL_CAPSULE | ORAL | Status: AC
  Administered 2022-03-26: 400 mg via ORAL
  Filled 2022-03-26: qty 2

## 2022-03-26 MED ORDER — LACTATED RINGERS IV SOLN: INTRAVENOUS | Status: DC

## 2022-03-26 MED ORDER — MIDAZOLAM HCL 2 MG/2ML IJ SOLN
INTRAMUSCULAR | Status: DC | PRN
  Administered 2022-03-26: 2 mg via INTRAVENOUS

## 2022-03-26 SURGICAL SUPPLY — 38 items
ADH SKN CLS APL DERMABOND .7 (GAUZE/BANDAGES/DRESSINGS) ×1
ADH SKN CLS LQ APL DERMABOND (GAUZE/BANDAGES/DRESSINGS) ×1
APL PRP STRL LF DISP 70% ISPRP (MISCELLANEOUS) ×1
BAG COUNTER SPONGE SURGICOUNT (BAG) ×1 IMPLANT
BAG SPNG CNTER NS LX DISP (BAG) ×1
BLADE CLIPPER SURG (BLADE) IMPLANT
CANISTER SUCT 3000ML PPV (MISCELLANEOUS) ×1 IMPLANT
CHLORAPREP W/TINT 26 (MISCELLANEOUS) ×1 IMPLANT
COVER SURGICAL LIGHT HANDLE (MISCELLANEOUS) ×1 IMPLANT
DERMABOND ADVANCED .7 DNX12 (GAUZE/BANDAGES/DRESSINGS) ×1 IMPLANT
DERMABOND ADVANCED .7 DNX6 (GAUZE/BANDAGES/DRESSINGS) IMPLANT
DRAPE LAPAROTOMY 100X72 PEDS (DRAPES) ×1 IMPLANT
ELECT REM PT RETURN 9FT ADLT (ELECTROSURGICAL) ×1
ELECTRODE REM PT RTRN 9FT ADLT (ELECTROSURGICAL) ×1 IMPLANT
GLOVE BIO SURGEON STRL SZ8 (GLOVE) ×1 IMPLANT
GLOVE BIOGEL PI IND STRL 8 (GLOVE) ×1 IMPLANT
GOWN STRL REUS W/ TWL LRG LVL3 (GOWN DISPOSABLE) ×1 IMPLANT
GOWN STRL REUS W/ TWL XL LVL3 (GOWN DISPOSABLE) ×1 IMPLANT
GOWN STRL REUS W/TWL LRG LVL3 (GOWN DISPOSABLE) ×1
GOWN STRL REUS W/TWL XL LVL3 (GOWN DISPOSABLE) ×1
KIT BASIN OR (CUSTOM PROCEDURE TRAY) ×1 IMPLANT
KIT TURNOVER KIT B (KITS) ×1 IMPLANT
MESH VENTRALEX ST 1-7/10 CRC S (Mesh General) IMPLANT
NDL 22X1.5 STRL (OR ONLY) (MISCELLANEOUS) ×1 IMPLANT
NEEDLE 22X1.5 STRL (OR ONLY) (MISCELLANEOUS) ×1 IMPLANT
NS IRRIG 1000ML POUR BTL (IV SOLUTION) ×1 IMPLANT
PACK GENERAL/GYN (CUSTOM PROCEDURE TRAY) ×1 IMPLANT
PAD ARMBOARD 7.5X6 YLW CONV (MISCELLANEOUS) ×1 IMPLANT
PENCIL SMOKE EVACUATOR (MISCELLANEOUS) ×1 IMPLANT
SUT MNCRL AB 4-0 PS2 18 (SUTURE) ×1 IMPLANT
SUT PROLENE 0 CT 1 30 (SUTURE) ×2 IMPLANT
SUT VIC AB 2-0 CT1 27 (SUTURE) ×1
SUT VIC AB 2-0 CT1 TAPERPNT 27 (SUTURE) ×1 IMPLANT
SUT VIC AB 3-0 SH 27 (SUTURE) ×1
SUT VIC AB 3-0 SH 27XBRD (SUTURE) ×1 IMPLANT
SYR CONTROL 10ML LL (SYRINGE) ×1 IMPLANT
TOWEL GREEN STERILE (TOWEL DISPOSABLE) ×1 IMPLANT
TOWEL GREEN STERILE FF (TOWEL DISPOSABLE) ×1 IMPLANT

## 2022-03-26 NOTE — Op Note (Addendum)
  03/26/2022  9:12 AM  PATIENT:  George Park  37 y.o. male  PRE-OPERATIVE DIAGNOSIS:  supraumbilical hernia  POST-OPERATIVE DIAGNOSIS:  supraumbilical hernia  PROCEDURE:  Procedure(s): REPAIR SUPRA-UMBILICAL HERNIA WITH MESH (4.3CM VENTRALEX) HERNIA DEFECT 1.5CM  SURGEON:  Surgeon(s): Georganna Skeans, MD  ASSISTANTS: none   ANESTHESIA:   local and general  EBL:  Total I/O In: 100 [IV Piggyback:100] Out: -   BLOOD ADMINISTERED:none  DRAINS: none   SPECIMEN:  No Specimen  DISPOSITION OF SPECIMEN:  N/A  COUNTS:  YES  DICTATION: .Dragon Dictation Procedure in detail: Informed consent was obtained.  He received intravenous antibiotics.  He was brought to the operating room and general endotracheal anesthesia was administered by the anesthesia staff.  His abdomen was prepped and draped in a sterile fashion.  We did a timeout procedure.  Local was injected above his umbilicus.  I made a small midline incision over the palpable hernia.  Subcutaneous tissues were dissected down and around the hernia sac.  The hernia sac with was freed up circumferentially from the fascia and then it was excised.  It only contained omentum.  There was a chronically incarcerated piece of omentum which I excised.  Cautery was used for good hemostasis.  I then freed up some other omental adhesions up to the inside of the abdominal wall to facilitate the repair.  Once this was accomplished I did make sure there was good hemostasis.  I then repaired the hernia with a 4.3 cm Ventralex mesh.  This was in an inlay fashion.  I used 0 Prolene to first tack it to the fascia superiorly and inferiorly going through the straps.  Next the straps were trimmed off.  Finally, I placed interrupted transverse sutures to close the fascia over the mesh and did incorporate a bit of the mesh with each of the stitches.  The wound was irrigated hemostasis was ensured.  Deep tissues were approximated with interrupted 2-0  Vicryl.  Skin was closed with 4-0 Monocryl subcuticular followed by Dermabond.  All counts were correct.  He tolerated the procedure well without apparent complication was taken recovery in stable condition. PATIENT DISPOSITION:  PACU - hemodynamically stable.   Delay start of Pharmacological VTE agent (>24hrs) due to surgical blood loss or risk of bleeding:  no  Georganna Skeans, MD, MPH, FACS Pager: [phone]  3/13/20249:12 AM

## 2022-03-26 NOTE — Anesthesia Procedure Notes (Signed)
Procedure Name: Intubation Date/Time: 03/26/2022 8:29 AM  Performed by: Harden Mo, CRNAPre-anesthesia Checklist: Patient identified, Emergency Drugs available, Suction available and Patient being monitored Patient Re-evaluated:Patient Re-evaluated prior to induction Oxygen Delivery Method: Circle System Utilized Preoxygenation: Pre-oxygenation with 100% oxygen Induction Type: IV induction Ventilation: Mask ventilation without difficulty Laryngoscope Size: Miller and 2 Grade View: Grade I Tube type: Oral Tube size: 7.5 mm Number of attempts: 1 Airway Equipment and Method: Stylet and Oral airway Placement Confirmation: ETT inserted through vocal cords under direct vision, positive ETCO2 and breath sounds checked- equal and bilateral Secured at: 23 cm Tube secured with: Tape Dental Injury: Teeth and Oropharynx as per pre-operative assessment

## 2022-03-26 NOTE — H&P (Signed)
George Park is an 37 y.o. male.   Chief Complaint: supraumbilical hernia HPI: Presents for repair of supraumbilical hernia with mesh. Symptoms have been stable since I saw him in the office.  Past Medical History:  Diagnosis Date   Asthma     Past Surgical History:  Procedure Laterality Date   LEG SURGERY      No family history on file. Social History:  reports that he has been smoking cigarettes. He does not have any smokeless tobacco history on file. He reports that he does not drink alcohol and does not use drugs.  Allergies: No Known Allergies  Medications Prior to Admission  Medication Sig Dispense Refill   hydrOXYzine (ATARAX) 50 MG tablet Take 50 mg by mouth at bedtime. (2000)     venlafaxine XR (EFFEXOR-XR) 75 MG 24 hr capsule Take 75 mg by mouth daily with breakfast. (0800)      No results found for this or any previous visit (from the past 48 hour(s)). No results found.  Review of Systems  There were no vitals taken for this visit. Physical Exam Eyes:     Pupils: Pupils are equal, round, and reactive to light.  Cardiovascular:     Rate and Rhythm: Normal rate and regular rhythm.  Pulmonary:     Effort: Pulmonary effort is normal.     Breath sounds: Normal breath sounds.  Abdominal:     General: Abdomen is flat.     Palpations: Abdomen is soft.     Hernia: A hernia is present.     Comments: Supraumbilical hernia  Musculoskeletal:        General: Normal range of motion.  Skin:    General: Skin is warm.  Neurological:     Mental Status: He is alert and oriented to person, place, and time.  Psychiatric:        Mood and Affect: Mood normal.      Assessment/Plan Supraumbilical hernia - for repair with mesh. Procedure, risks, and benefits discussed. He agrees.  Zenovia Jarred, MD 03/26/2022, 7:33 AM

## 2022-03-26 NOTE — Anesthesia Postprocedure Evaluation (Signed)
Anesthesia Post Note  Patient: George Park  Procedure(s) Performed: REPAIR SUPRA-UMBILICAL HERNIA WITH MESH     Patient location during evaluation: PACU Anesthesia Type: General Level of consciousness: awake Pain management: pain level controlled Vital Signs Assessment: post-procedure vital signs reviewed and stable Respiratory status: spontaneous breathing, nonlabored ventilation and respiratory function stable Cardiovascular status: blood pressure returned to baseline and stable Postop Assessment: no apparent nausea or vomiting Anesthetic complications: no   No notable events documented.  Last Vitals:  Vitals:   03/26/22 1000 03/26/22 1015  BP: 122/86 (!) 126/91  Pulse: (!) 57 (!) 52  Resp: 11 14  Temp:  (!) 36.1 C  SpO2: 96% 98%    Last Pain:  Vitals:   03/26/22 1000  TempSrc:   PainSc: 10-Worst pain ever                 Nilda Simmer

## 2022-03-26 NOTE — Transfer of Care (Signed)
Immediate Anesthesia Transfer of Care Note  Patient: George Park  Procedure(s) Performed: REPAIR SUPRA-UMBILICAL HERNIA WITH MESH  Patient Location: PACU  Anesthesia Type:General  Level of Consciousness: drowsy and patient cooperative  Airway & Oxygen Therapy: Patient Spontanous Breathing  Post-op Assessment: Report given to RN and Post -op Vital signs reviewed and stable  Post vital signs: Reviewed and stable  Last Vitals:  Vitals Value Taken Time  BP 122/80 03/26/22 0930  Temp 36.2 C 03/26/22 0924  Pulse 60 03/26/22 0938  Resp 13 03/26/22 0938  SpO2 95 % 03/26/22 0938  Vitals shown include unvalidated device data.  Last Pain:  Vitals:   03/26/22 0924  TempSrc:   PainSc: Asleep         Complications: No notable events documented.

## 2022-03-27 ENCOUNTER — Encounter (HOSPITAL_COMMUNITY): Payer: Self-pay | Admitting: General Surgery

## 2023-03-26 NOTE — Telephone Encounter (Signed)
 Provider out 5/15-5/16 per LR. Rescheduling appointment from 5/15. Mailed rescheduled reminder and new appointment time date location reminder on 3/13.

## 2023-11-17 ENCOUNTER — Emergency Department (HOSPITAL_COMMUNITY): Payer: Self-pay

## 2023-11-17 ENCOUNTER — Encounter (HOSPITAL_COMMUNITY): Payer: Self-pay

## 2023-11-17 ENCOUNTER — Emergency Department (HOSPITAL_COMMUNITY)
Admission: EM | Admit: 2023-11-17 | Discharge: 2023-11-17 | Disposition: A | Discharge status: Home / Self Care | Payer: Self-pay | Attending: Emergency Medicine | Admitting: Emergency Medicine

## 2023-11-17 ENCOUNTER — Other Ambulatory Visit: Payer: Self-pay

## 2023-11-17 DIAGNOSIS — F1910 Other psychoactive substance abuse, uncomplicated: Secondary | ICD-10-CM | POA: Insufficient documentation

## 2023-11-17 DIAGNOSIS — I10 Essential (primary) hypertension: Secondary | ICD-10-CM | POA: Insufficient documentation

## 2023-11-17 DIAGNOSIS — R0789 Other chest pain: Secondary | ICD-10-CM | POA: Insufficient documentation

## 2023-11-17 LAB — CBC WITH DIFFERENTIAL/PLATELET
Abs Immature Granulocytes: 0.01 K/uL (ref 0.00–0.07)
Basophils Absolute: 0.1 K/uL (ref 0.0–0.1)
Basophils Relative: 1 %
Eosinophils Absolute: 0.2 K/uL (ref 0.0–0.5)
Eosinophils Relative: 3 %
HCT: 43.8 % (ref 39.0–52.0)
Hemoglobin: 14.1 g/dL (ref 13.0–17.0)
Immature Granulocytes: 0 %
Lymphocytes Relative: 21 %
Lymphs Abs: 1.2 K/uL (ref 0.7–4.0)
MCH: 26.8 pg (ref 26.0–34.0)
MCHC: 32.2 g/dL (ref 30.0–36.0)
MCV: 83.1 fL (ref 80.0–100.0)
Monocytes Absolute: 0.5 K/uL (ref 0.1–1.0)
Monocytes Relative: 9 %
Neutro Abs: 3.7 K/uL (ref 1.7–7.7)
Neutrophils Relative %: 66 %
Platelets: 309 K/uL (ref 150–400)
RBC: 5.27 MIL/uL (ref 4.22–5.81)
RDW: 14 % (ref 11.5–15.5)
WBC: 5.6 K/uL (ref 4.0–10.5)
nRBC: 0 % (ref 0.0–0.2)

## 2023-11-17 LAB — TROPONIN T, HIGH SENSITIVITY
Troponin T High Sensitivity: <15 ng/L (ref 0–19)
Troponin T High Sensitivity: <15 ng/L (ref 0–19)

## 2023-11-17 LAB — SALICYLATE LEVEL: Salicylate Lvl: <7 mg/dL — ABNORMAL LOW (ref 7.0–30.0)

## 2023-11-17 LAB — URINE DRUG SCREEN
Amphetamines: POSITIVE — AB
Barbiturates: NEGATIVE
Benzodiazepines: NEGATIVE
Cocaine: POSITIVE — AB
Fentanyl: NEGATIVE
Methadone Scn, Ur: NEGATIVE
Opiates: NEGATIVE
Tetrahydrocannabinol: POSITIVE — AB

## 2023-11-17 LAB — COMPREHENSIVE METABOLIC PANEL WITH GFR
ALT: 11 U/L (ref 0–44)
AST: 28 U/L (ref 15–41)
Albumin: 4.3 g/dL (ref 3.5–5.0)
Alkaline Phosphatase: 76 U/L (ref 38–126)
Anion gap: 12 (ref 5–15)
BUN: 9 mg/dL (ref 6–20)
CO2: 27 mmol/L (ref 22–32)
Calcium: 9.8 mg/dL (ref 8.9–10.3)
Chloride: 101 mmol/L (ref 98–111)
Creatinine, Ser: 0.95 mg/dL (ref 0.61–1.24)
GFR, Estimated: >60 mL/min (ref >60)
Glucose, Bld: 113 mg/dL — ABNORMAL HIGH (ref 70–99)
Potassium: 3.8 mmol/L (ref 3.5–5.1)
Sodium: 139 mmol/L (ref 135–145)
Total Bilirubin: 1 mg/dL (ref 0.0–1.2)
Total Protein: 7.7 g/dL (ref 6.5–8.1)

## 2023-11-17 LAB — ETHANOL: Alcohol, Ethyl (B): <15 mg/dL (ref <15)

## 2023-11-17 LAB — ACETAMINOPHEN LEVEL: Acetaminophen (Tylenol), Serum: <10 ug/mL — ABNORMAL LOW (ref 10–30)

## 2023-11-17 MED ORDER — LORAZEPAM 2 MG/ML IJ SOLN
1.0000 mg | Freq: Once | INTRAMUSCULAR | Status: AC
  Administered 2023-11-17: 1 mg via INTRAVENOUS
  Filled 2023-11-17: qty 1

## 2023-11-17 MED ORDER — HALOPERIDOL LACTATE 5 MG/ML IJ SOLN
5.0000 mg | Freq: Once | INTRAMUSCULAR | Status: AC
  Administered 2023-11-17: 5 mg via INTRAMUSCULAR
  Filled 2023-11-17: qty 1

## 2023-11-17 NOTE — ED Provider Notes (Signed)
 [facility] EMERGENCY DEPARTMENT AT [facility] Provider Note   CSN: [id number] Arrival date & time: 11/17/23  0036     Patient presents with: Medical Clearance   George Park is a 38 y.o. male.   The history is provided by the patient, the EMS personnel and the police.  George Park is a 38 y.o. male who presents to the Emergency Department complaining of chest pain and possible ingestion.  He presents to the emergency department by EMS in police custody for evaluation of chest pain.  Police believe that the patient may have ingested drugs in a baggy but cannot confirm.  Patient complains of chest pain on the left side.  Has mild shortness of breath.  He also reports that he has severe anxiety and this is worsening.  He reports a history of hypertension.  He denies any drug use.  He denies any SI, HI.     Prior to Admission medications   Medication Sig Start Date End Date Taking? Authorizing Provider  hydrOXYzine (ATARAX) 50 MG tablet Take 50 mg by mouth at bedtime. (2000)    [provider]  venlafaxine XR (EFFEXOR-XR) 75 MG 24 hr capsule Take 75 mg by mouth daily with breakfast. (0800)    [provider]    Allergies: Patient has no known allergies.    Review of Systems  All other systems reviewed and are negative.   Updated Vital Signs BP 124/70   Pulse 75   Temp 98.3 F (36.8 C) (Oral)   Resp 19   Ht 5' 8 (1.727 m)   Wt 77.1 kg   SpO2 100%   BMI 25.85 kg/m   Physical Exam Vitals and nursing note reviewed.  Constitutional:      Appearance: He is well-developed.  HENT:     Head: Normocephalic and atraumatic.  Cardiovascular:     Rate and Rhythm: Normal rate and regular rhythm.     Heart sounds: No murmur heard. Pulmonary:     Effort: Pulmonary effort is normal. No respiratory distress.     Breath sounds: Normal breath sounds.  Abdominal:     Palpations: Abdomen is soft.     Tenderness: There is no abdominal  tenderness. There is no guarding or rebound.  Musculoskeletal:        General: No tenderness.  Skin:    General: Skin is warm and dry.  Neurological:     Mental Status: He is alert and oriented to person, place, and time.  Psychiatric:     Comments: Mildly agitated but redirectable     (all labs ordered are listed, but only abnormal results are displayed) Labs Reviewed  ACETAMINOPHEN  LEVEL - Abnormal; Notable for the following components:      Result Value   Acetaminophen  (Tylenol ), Serum <10 (*)    All other components within normal limits  SALICYLATE LEVEL - Abnormal; Notable for the following components:   Salicylate Lvl <7.0 (*)    All other components within normal limits  COMPREHENSIVE METABOLIC PANEL WITH GFR - Abnormal; Notable for the following components:   Glucose, Bld 113 (*)    All other components within normal limits  URINE DRUG SCREEN - Abnormal; Notable for the following components:   Cocaine POSITIVE (*)    Amphetamines POSITIVE (*)    Tetrahydrocannabinol POSITIVE (*)    All other components within normal limits  CBC WITH DIFFERENTIAL/PLATELET  ETHANOL  TROPONIN T, HIGH SENSITIVITY  TROPONIN T, HIGH SENSITIVITY    EKG:  EKG Interpretation Date/Time:  Tuesday November 17 2023 01:33:13 EST Ventricular Rate:  81 PR Interval:  155 QRS Duration:  93 QT Interval:  371 QTC Calculation: 431 R Axis:   79  Text Interpretation: Sinus rhythm Confirmed by Griselda Norris [phone]) on 11/17/2023 2:16:51 AM  Radiology: [facility] Chest Port 1 View Result Date: 11/17/2023 EXAM: 1 VIEW(S) XRAY OF THE CHEST 11/17/2023 04:17:00 AM COMPARISON: 01/01/22 CLINICAL HISTORY: chest pain FINDINGS: LUNGS AND PLEURA: No focal pulmonary opacity. No pulmonary edema. No pleural effusion. No pneumothorax. HEART AND MEDIASTINUM: No acute abnormality of the cardiac and mediastinal silhouettes. BONES AND SOFT TISSUES: No acute osseous abnormality. IMPRESSION: 1. No acute cardiopulmonary process  identified. Electronically signed by: Waddell Calk MD 11/17/2023 05:26 AM EST RP Workstation: [id number]   DG Abdomen 1 View Result Date: 11/17/2023 EXAM: 1 VIEW XRAY OF THE ABDOMEN 11/17/2023 04:16:00 AM COMPARISON: None available. CLINICAL HISTORY: possible ingestion of baggies of drugs FINDINGS: LINES, TUBES AND DEVICES: Overlying leads noted. BOWEL: Nonobstructive bowel gas pattern. SOFT TISSUES: No opaque urinary calculi. BONES: No acute osseous abnormality. IMPRESSION: 1. No acute abdominal abnormality identified. 2. No radiopaque foreign bodies. Electronically signed by: Waddell Calk MD 11/17/2023 05:25 AM EST RP Workstation: [id number]     Procedures   Medications Ordered in the ED  haloperidol lactate (HALDOL) injection 5 mg (5 mg Intramuscular Given 11/17/23 0122)  LORazepam (ATIVAN) injection 1 mg (1 mg Intravenous Given 11/17/23 0122)                                    Medical Decision Making Amount and/or Complexity of Data Reviewed Labs: ordered. Radiology: ordered.  Risk Prescription drug management.   Patient brought to the emergency department in police custody for possible ingestion of drugs, complaint of chest pain.  Patient agitated, anxious at time of ED arrival, complaints of chest pain.  He was treated with lorazepam and haloperidol for his agitation with significant improvement in the symptoms.  In terms of his chest pain EKG is nonischemic and troponins are negative x 2.  Patient without respiratory symptoms.  Current picture is not consistent with ACS or PE or dissection.  CBC, CMP are unremarkable.  UDS is significant for cocaine, amphetamine and THC.  On repeat assessment after several hours of observation in the emergency department discussed with patient whether or not he ingested a baggy of drugs patient denies this.  Discussed that there could be serious risk if he did swallow a large amount of drugs.  He acknowledges this risk and states this did not happen.   Plain films are negative for any signs of foreign body.  Patient is able to ambulate with a steady gait on repeat evaluation.  He does complain of new shoulder pain.  On exam it is nontender with no palpable deformity.  Will obtain an x-ray.  X-ray is negative for acute abnormality.  Images personally reviewed and interpreted, agree with radiologist interpretation.  Feel patient is stable for discharge in police custody.     Final diagnoses:  Atypical chest pain  Polysubstance abuse [facility])    ED Discharge Orders     None          Griselda Norris, MD 11/17/23 2243

## 2023-11-17 NOTE — ED Notes (Signed)
 Patient is still out from meds given earlier. Will try obtain full set vitals when patient wakes up.

## 2023-11-17 NOTE — ED Notes (Signed)
 Pt walked to the bathroom with out complaints other than right shoulder. MD notified.

## 2023-11-17 NOTE — ED Triage Notes (Signed)
 Pt BIBA c/o chest pain. Pt uncooperative, refused IV and treatment with EMS. Pt presents combative.   126/76 HR 110

## 2023-11-17 NOTE — ED Notes (Signed)
 Patient resting comfortably. PD sitting outside patients room.

## 2023-11-27 ENCOUNTER — Ambulatory Visit (HOSPITAL_COMMUNITY)
Admission: EM | Admit: 2023-11-27 | Discharge: 2023-11-27 | Disposition: A | Discharge status: Home / Self Care | Attending: Psychiatry | Admitting: Psychiatry

## 2023-11-27 DIAGNOSIS — Z91148 Patient's other noncompliance with medication regimen for other reason: Secondary | ICD-10-CM | POA: Insufficient documentation

## 2023-11-27 DIAGNOSIS — F419 Anxiety disorder, unspecified: Secondary | ICD-10-CM | POA: Insufficient documentation

## 2023-11-27 DIAGNOSIS — F4310 Post-traumatic stress disorder, unspecified: Secondary | ICD-10-CM | POA: Insufficient documentation

## 2023-11-27 DIAGNOSIS — Z818 Family history of other mental and behavioral disorders: Secondary | ICD-10-CM | POA: Insufficient documentation

## 2023-11-27 DIAGNOSIS — Z5902 Unsheltered homelessness: Secondary | ICD-10-CM | POA: Insufficient documentation

## 2023-11-27 DIAGNOSIS — F4323 Adjustment disorder with mixed anxiety and depressed mood: Secondary | ICD-10-CM | POA: Insufficient documentation

## 2023-11-27 MED ORDER — VENLAFAXINE HCL ER 75 MG PO CP24
75.0000 mg | ORAL_CAPSULE | Freq: Every day | ORAL | Status: DC
  Filled 2023-11-27: qty 7

## 2023-11-27 MED ORDER — HYDROXYZINE HCL 25 MG PO TABS
50.0000 mg | ORAL_TABLET | Freq: Every day | ORAL | Status: DC
  Filled 2023-11-27: qty 14

## 2023-11-27 NOTE — Discharge Instructions (Addendum)
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
